# Patient Record
Sex: Female | Born: 1995 | Race: White | Hispanic: No | Marital: Single | Smoking: Never smoker
Health system: Southern US, Community
[De-identification: ages and names within clinical notes are randomized; demographics above are authoritative.]

## PROBLEM LIST (undated history)

## (undated) DIAGNOSIS — K589 Irritable bowel syndrome without diarrhea: Secondary | ICD-10-CM

## (undated) HISTORY — DX: Irritable bowel syndrome without diarrhea: K58.9

---
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
## 2016-08-27 DIAGNOSIS — J029 Acute pharyngitis, unspecified: Secondary | ICD-10-CM | POA: Diagnosis not present

## 2016-08-27 DIAGNOSIS — L700 Acne vulgaris: Secondary | ICD-10-CM | POA: Diagnosis not present

## 2016-10-05 DIAGNOSIS — J358 Other chronic diseases of tonsils and adenoids: Secondary | ICD-10-CM | POA: Diagnosis not present

## 2016-12-01 DIAGNOSIS — J019 Acute sinusitis, unspecified: Secondary | ICD-10-CM | POA: Diagnosis not present

## 2017-01-26 DIAGNOSIS — Z23 Encounter for immunization: Secondary | ICD-10-CM | POA: Diagnosis not present

## 2017-05-24 DIAGNOSIS — K219 Gastro-esophageal reflux disease without esophagitis: Secondary | ICD-10-CM | POA: Diagnosis not present

## 2017-05-25 ENCOUNTER — Other Ambulatory Visit (HOSPITAL_COMMUNITY): Payer: Self-pay | Admitting: Family Medicine

## 2017-05-25 DIAGNOSIS — R1011 Right upper quadrant pain: Secondary | ICD-10-CM

## 2017-05-25 DIAGNOSIS — R1013 Epigastric pain: Secondary | ICD-10-CM

## 2017-06-02 ENCOUNTER — Other Ambulatory Visit (HOSPITAL_COMMUNITY): Payer: Self-pay | Admitting: Family Medicine

## 2017-06-02 ENCOUNTER — Ambulatory Visit (HOSPITAL_COMMUNITY)
Admission: RE | Admit: 2017-06-02 | Discharge: 2017-06-02 | Disposition: A | Discharge status: Home / Self Care | Payer: 59 | Source: Ambulatory Visit | Attending: Family Medicine | Admitting: Family Medicine

## 2017-06-02 DIAGNOSIS — R1011 Right upper quadrant pain: Secondary | ICD-10-CM | POA: Diagnosis not present

## 2017-06-02 DIAGNOSIS — R1013 Epigastric pain: Secondary | ICD-10-CM | POA: Diagnosis not present

## 2017-07-08 DIAGNOSIS — A084 Viral intestinal infection, unspecified: Secondary | ICD-10-CM | POA: Diagnosis not present

## 2017-08-28 DIAGNOSIS — R5383 Other fatigue: Secondary | ICD-10-CM | POA: Diagnosis not present

## 2017-11-04 DIAGNOSIS — R002 Palpitations: Secondary | ICD-10-CM | POA: Diagnosis not present

## 2017-11-04 DIAGNOSIS — J039 Acute tonsillitis, unspecified: Secondary | ICD-10-CM | POA: Diagnosis not present

## 2017-11-15 ENCOUNTER — Encounter: Payer: Self-pay | Admitting: Advanced Practice Midwife

## 2017-12-07 DIAGNOSIS — Z6832 Body mass index (BMI) 32.0-32.9, adult: Secondary | ICD-10-CM | POA: Diagnosis not present

## 2017-12-07 DIAGNOSIS — R5383 Other fatigue: Secondary | ICD-10-CM | POA: Diagnosis not present

## 2017-12-29 DIAGNOSIS — Z6833 Body mass index (BMI) 33.0-33.9, adult: Secondary | ICD-10-CM | POA: Diagnosis not present

## 2017-12-29 DIAGNOSIS — R5383 Other fatigue: Secondary | ICD-10-CM | POA: Diagnosis not present

## 2017-12-29 DIAGNOSIS — Z23 Encounter for immunization: Secondary | ICD-10-CM | POA: Diagnosis not present

## 2018-01-23 DIAGNOSIS — R300 Dysuria: Secondary | ICD-10-CM | POA: Diagnosis not present

## 2018-01-23 DIAGNOSIS — K5900 Constipation, unspecified: Secondary | ICD-10-CM | POA: Diagnosis not present

## 2018-01-23 DIAGNOSIS — Z8349 Family history of other endocrine, nutritional and metabolic diseases: Secondary | ICD-10-CM | POA: Diagnosis not present

## 2018-04-17 DIAGNOSIS — Z6834 Body mass index (BMI) 34.0-34.9, adult: Secondary | ICD-10-CM | POA: Diagnosis not present

## 2018-05-08 DIAGNOSIS — Z23 Encounter for immunization: Secondary | ICD-10-CM | POA: Diagnosis not present

## 2018-06-29 DIAGNOSIS — Z6832 Body mass index (BMI) 32.0-32.9, adult: Secondary | ICD-10-CM | POA: Diagnosis not present

## 2018-08-27 DIAGNOSIS — J358 Other chronic diseases of tonsils and adenoids: Secondary | ICD-10-CM | POA: Diagnosis not present

## 2018-08-27 DIAGNOSIS — Z6833 Body mass index (BMI) 33.0-33.9, adult: Secondary | ICD-10-CM | POA: Diagnosis not present

## 2019-05-31 IMAGING — US US ABDOMEN COMPLETE
1 series · 14 of 25 positions shown · non-contrast
Comparison: None.

CLINICAL DATA: Right upper quadrant abdominal pain and epigastric
pain.

EXAM:
ABDOMEN ULTRASOUND COMPLETE

[Series 1: us abdomen complete · 0.18mm/px · 14 of 102 slices shown]
[im 1/102]
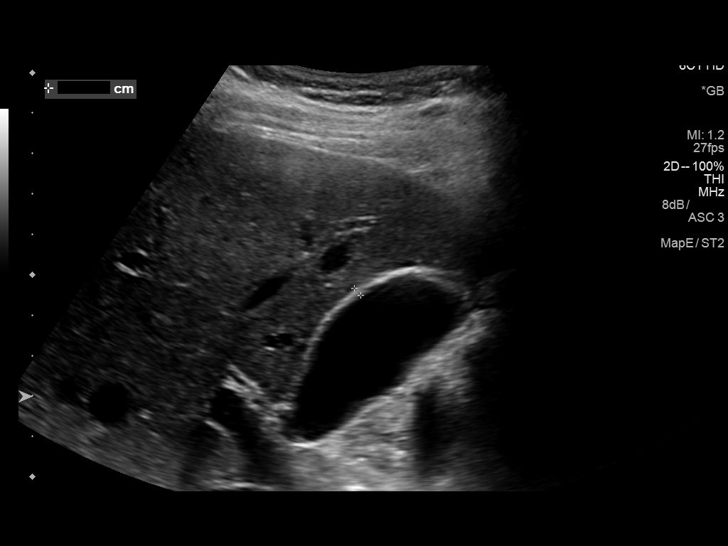
[im 9/102]
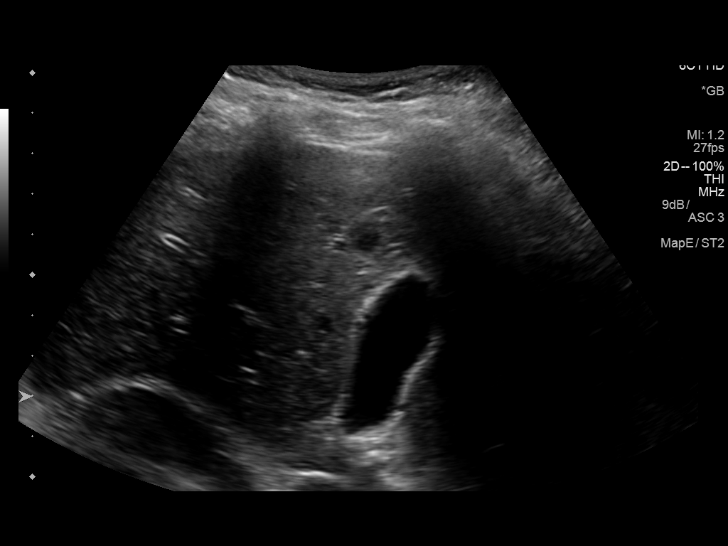
[im 17/102]
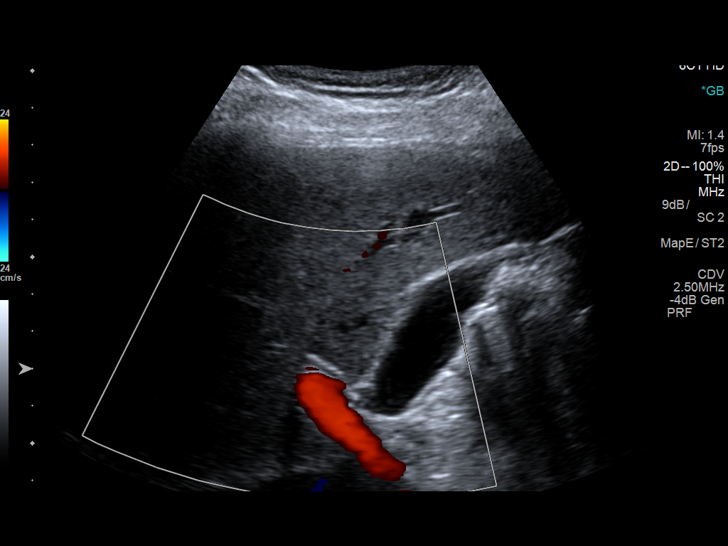
[im 26/102]
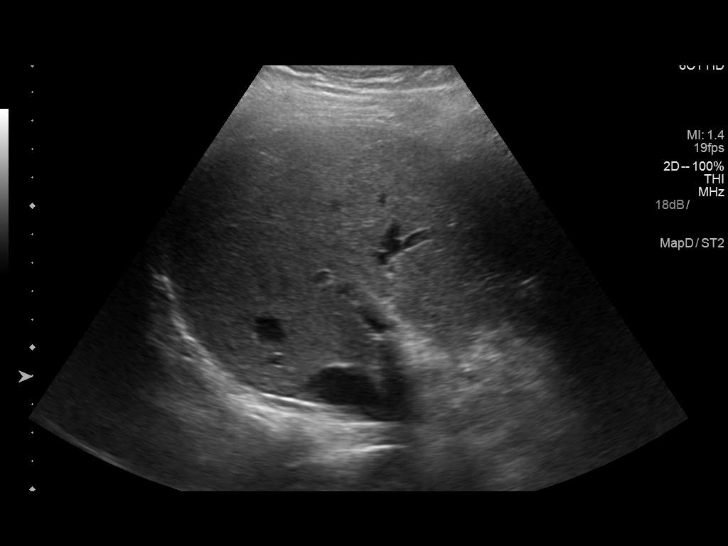
[im 34/102]
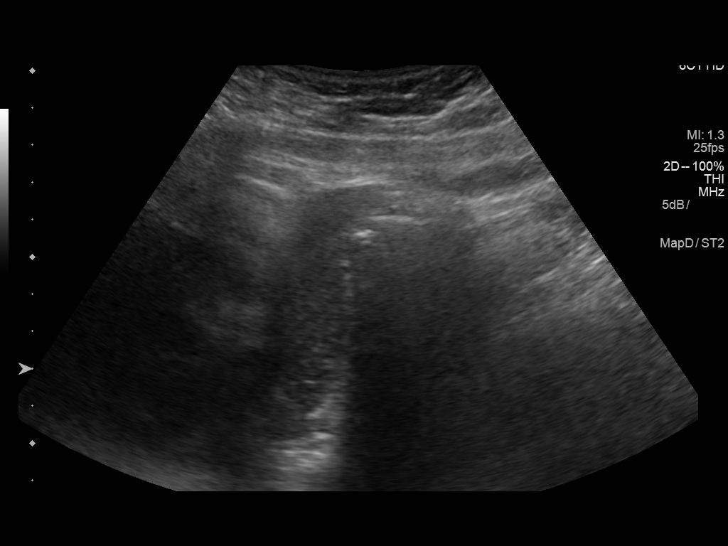
[im 38/102]
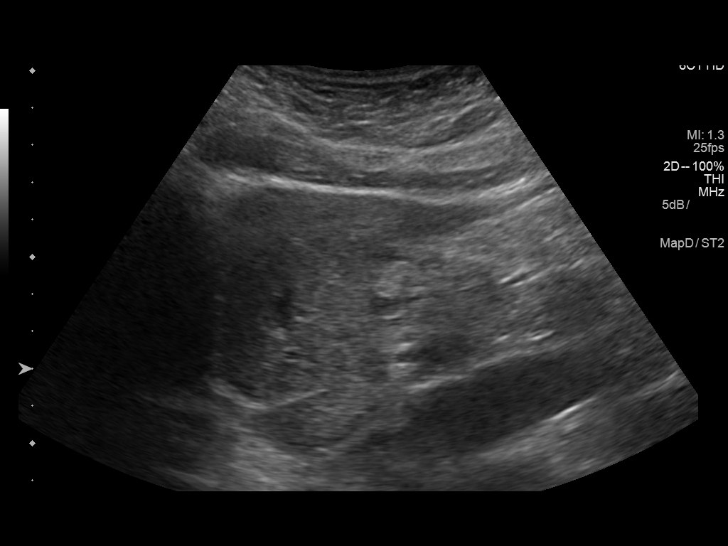
[im 47/102]
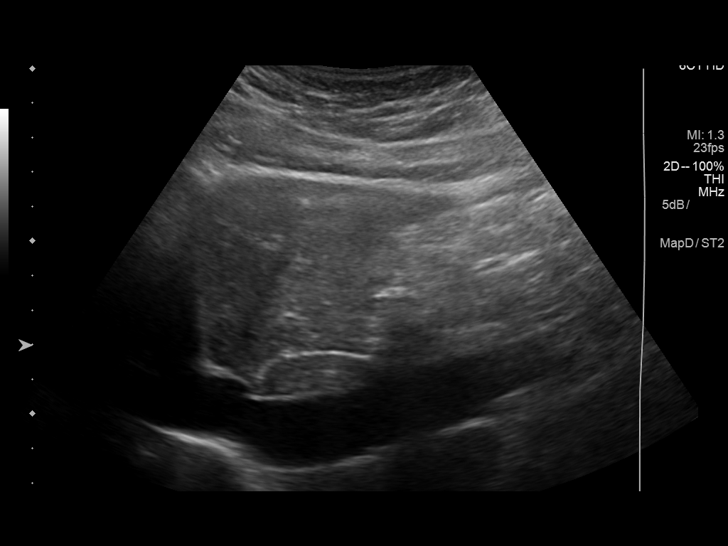
[im 55/102]
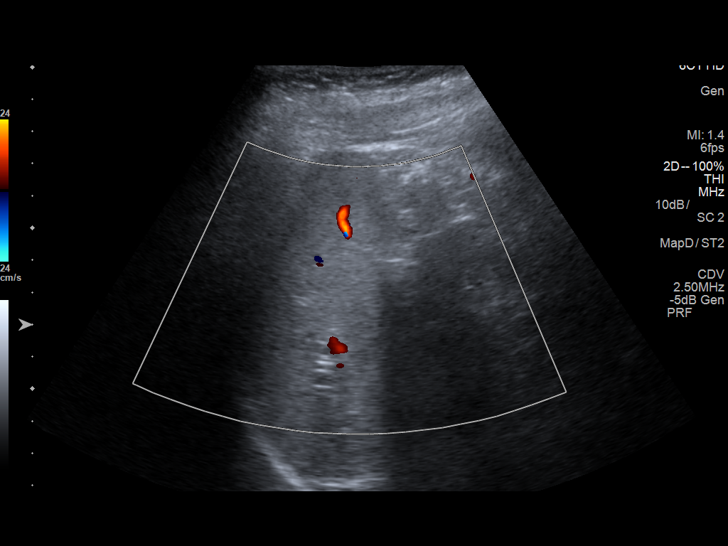
[im 64/102]
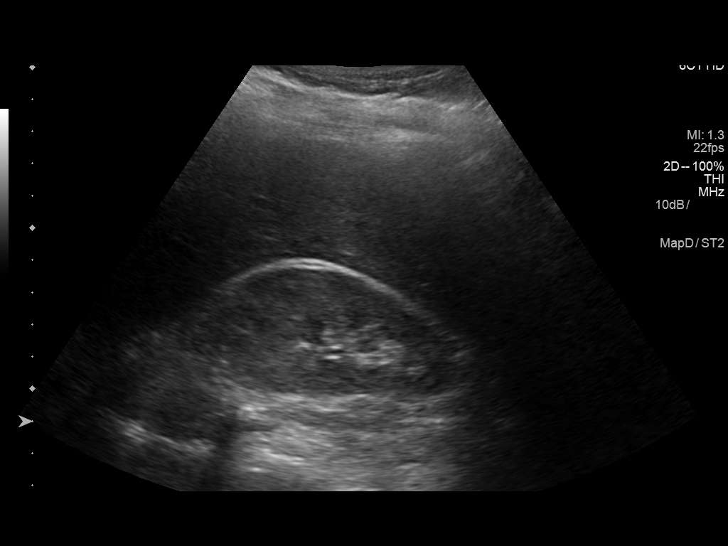
[im 68/102]
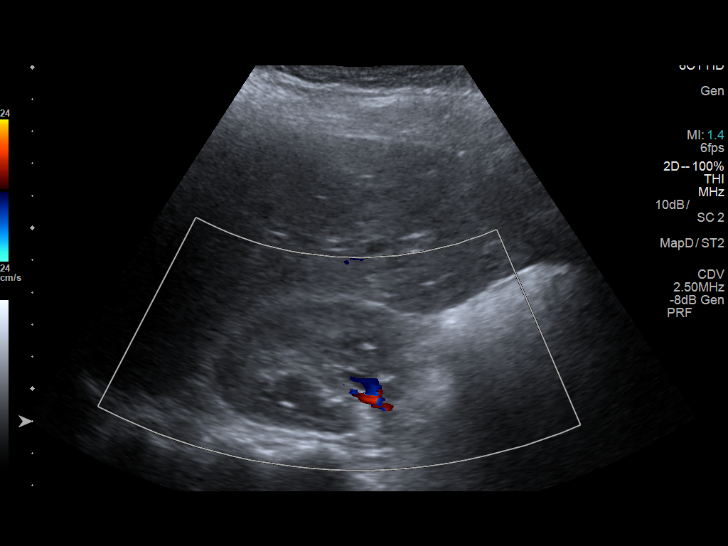
[im 76/102]
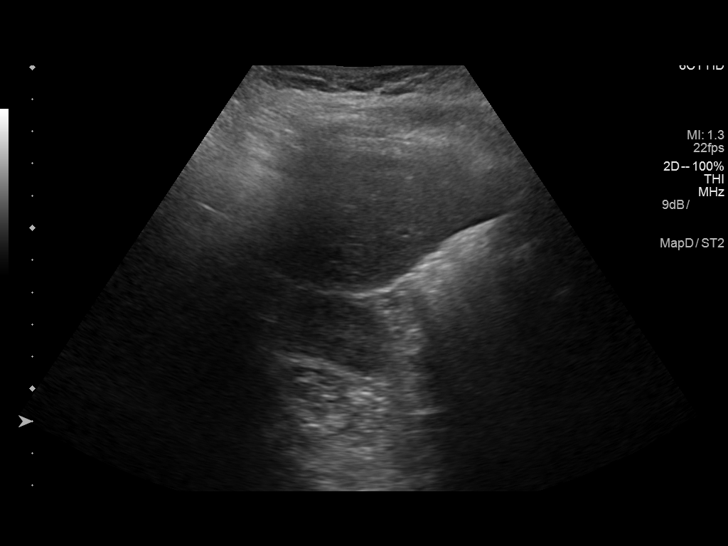
[im 85/102]
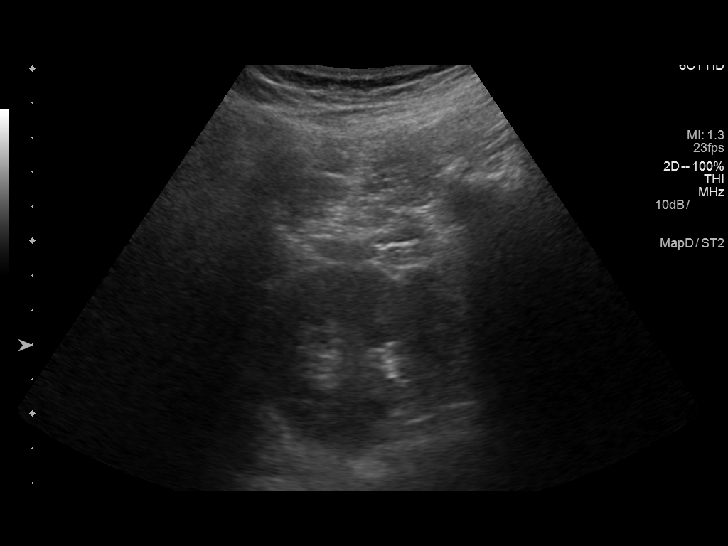
[im 93/102]
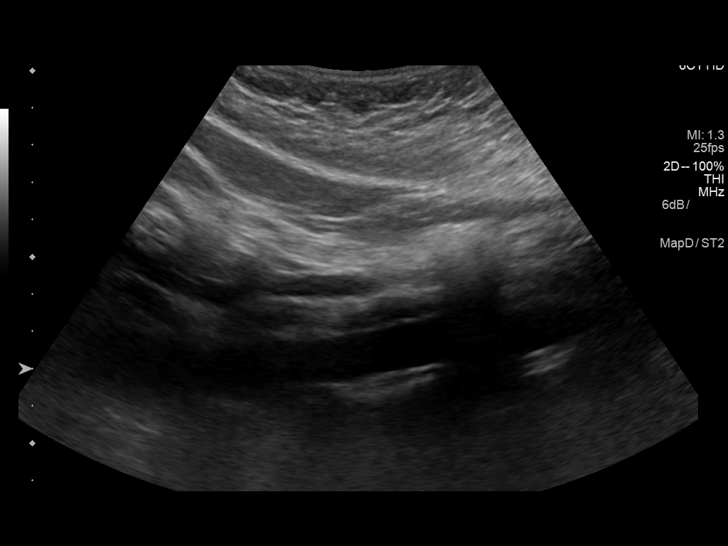
[im 102/102]
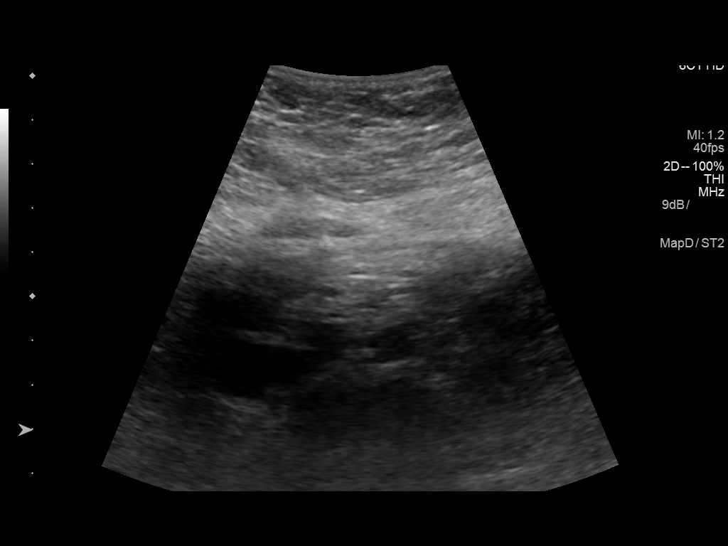

[14 of 25 positions shown; findings below may reference images not displayed]

FINDINGS: Gallbladder: No gallstones or wall thickening visualized. No
sonographic Murphy sign noted by sonographer.

Common bile duct: Diameter: 3 mm

Liver: No focal lesion identified. Within normal limits in
parenchymal echogenicity. Portal vein is patent on color Doppler
imaging with normal direction of blood flow towards the liver.

IVC: No abnormality visualized.

Pancreas: Visualized portion unremarkable. The pancreatic head and
tail were not well seen due to overlying bowel gas.

Spleen: Size and appearance within normal limits.

Right Kidney: Length: 9.4 cm. Echogenicity within normal limits. No
mass or hydronephrosis visualized.

Left Kidney: Length: 9.2 cm. Echogenicity within normal limits. No
mass or hydronephrosis visualized.

Abdominal aorta: No aneurysm visualized.

Other findings: None.
IMPRESSION: Normal sonographic appearance of the abdomen. Please note that
portions of the pancreatic head and tail were not well seen due to
overlying bowel gas.

## 2020-06-24 ENCOUNTER — Other Ambulatory Visit: Payer: Self-pay | Admitting: Physician Assistant

## 2020-06-24 ENCOUNTER — Other Ambulatory Visit (HOSPITAL_COMMUNITY): Payer: Self-pay | Admitting: Physician Assistant

## 2020-06-24 DIAGNOSIS — R1084 Generalized abdominal pain: Secondary | ICD-10-CM

## 2020-06-26 ENCOUNTER — Emergency Department (HOSPITAL_COMMUNITY)
Admission: EM | Admit: 2020-06-26 | Discharge: 2020-06-26 | Disposition: A | Discharge status: Home / Self Care | Payer: 59 | Attending: Emergency Medicine | Admitting: Emergency Medicine

## 2020-06-26 ENCOUNTER — Encounter (HOSPITAL_COMMUNITY): Payer: Self-pay | Admitting: *Deleted

## 2020-06-26 ENCOUNTER — Other Ambulatory Visit: Payer: Self-pay

## 2020-06-26 ENCOUNTER — Emergency Department (HOSPITAL_COMMUNITY): Payer: 59

## 2020-06-26 DIAGNOSIS — R1010 Upper abdominal pain, unspecified: Secondary | ICD-10-CM | POA: Diagnosis not present

## 2020-06-26 DIAGNOSIS — R112 Nausea with vomiting, unspecified: Secondary | ICD-10-CM | POA: Insufficient documentation

## 2020-06-26 DIAGNOSIS — R3589 Other polyuria: Secondary | ICD-10-CM | POA: Diagnosis not present

## 2020-06-26 DIAGNOSIS — R1084 Generalized abdominal pain: Secondary | ICD-10-CM | POA: Diagnosis not present

## 2020-06-26 DIAGNOSIS — R110 Nausea: Secondary | ICD-10-CM

## 2020-06-26 LAB — CBC
HCT: 43.3 % (ref 36.0–46.0)
Hemoglobin: 14.9 g/dL (ref 12.0–15.0)
MCH: 31.2 pg (ref 26.0–34.0)
MCHC: 34.4 g/dL (ref 30.0–36.0)
MCV: 90.6 fL (ref 80.0–100.0)
Platelets: 316 10*3/uL (ref 150–400)
RBC: 4.78 MIL/uL (ref 3.87–5.11)
RDW: 12.1 % (ref 11.5–15.5)
WBC: 6 10*3/uL (ref 4.0–10.5)
nRBC: 0 % (ref 0.0–0.2)

## 2020-06-26 LAB — COMPREHENSIVE METABOLIC PANEL
ALT: 17 U/L (ref 0–44)
AST: 19 U/L (ref 15–41)
Albumin: 3.9 g/dL (ref 3.5–5.0)
Alkaline Phosphatase: 45 U/L (ref 38–126)
Anion gap: 8 (ref 5–15)
BUN: 5 mg/dL — ABNORMAL LOW (ref 6–20)
CO2: 25 mmol/L (ref 22–32)
Calcium: 9.2 mg/dL (ref 8.9–10.3)
Chloride: 105 mmol/L (ref 98–111)
Creatinine, Ser: 1.13 mg/dL — ABNORMAL HIGH (ref 0.44–1.00)
GFR, Estimated: >60 mL/min (ref >60)
Glucose, Bld: 94 mg/dL (ref 70–99)
Potassium: 3.9 mmol/L (ref 3.5–5.1)
Sodium: 138 mmol/L (ref 135–145)
Total Bilirubin: 1.1 mg/dL (ref 0.3–1.2)
Total Protein: 7.1 g/dL (ref 6.5–8.1)

## 2020-06-26 LAB — URINALYSIS, ROUTINE W REFLEX MICROSCOPIC
Bilirubin Urine: NEGATIVE
Glucose, UA: NEGATIVE mg/dL
Ketones, ur: NEGATIVE mg/dL
Leukocytes,Ua: NEGATIVE
Nitrite: NEGATIVE
Protein, ur: NEGATIVE mg/dL
Specific Gravity, Urine: <1.005 — ABNORMAL LOW (ref 1.005–1.030)
pH: 5 (ref 5.0–8.0)

## 2020-06-26 LAB — I-STAT BETA HCG BLOOD, ED (MC, WL, AP ONLY): I-stat hCG, quantitative: <5 m[IU]/mL (ref <5)

## 2020-06-26 LAB — URINALYSIS, MICROSCOPIC (REFLEX)
Bacteria, UA: NONE SEEN
RBC / HPF: NONE SEEN RBC/hpf (ref 0–5)

## 2020-06-26 LAB — LIPASE, BLOOD: Lipase: 35 U/L (ref 11–51)

## 2020-06-26 MED ORDER — FENTANYL CITRATE (PF) 100 MCG/2ML IJ SOLN
50.0000 ug | Freq: Once | INTRAMUSCULAR | Status: DC
Start: 2020-06-26 — End: 2020-06-26

## 2020-06-26 MED ORDER — SODIUM CHLORIDE 0.9 % IV BOLUS
1000.0000 mL | Freq: Once | INTRAVENOUS | Status: AC
  Administered 2020-06-26: 1000 mL via INTRAVENOUS

## 2020-06-26 NOTE — Discharge Instructions (Addendum)
As discussed, your evaluation today has been largely reassuring.  But, it is important that you monitor your condition carefully, and do not hesitate to return to the ED if you develop new, or concerning changes in your condition. ? ?Otherwise, please follow-up with your physician for appropriate ongoing care. ? ?

## 2020-06-26 NOTE — ED Notes (Signed)
Reviewed discharge instructions with patient and mother. Follow-up care reviewed. Patient and mother verbalized understanding. Patient A&Ox4, VSS, and ambulatory with steady gait upon discharge. 

## 2020-06-26 NOTE — ED Notes (Signed)
Went to hook pt up to monitoring equipment and found that there are not cords for the monitor in the room. ED tech in search of cords.

## 2020-06-26 NOTE — ED Notes (Signed)
Pt transported to US

## 2020-06-26 NOTE — ED Triage Notes (Signed)
Increasing nausea for over a month ago, she has been seen for this and placed on zofran and omeprazole without relief.  Pt has been having night sweats also, she has had a lack of appetitie.  Pt feels dehydrated.  Pt denies any pregnancy and is on her period at this time.  Abdominal US scheduled at her MD for 3/24.

## 2020-06-26 NOTE — ED Provider Notes (Signed)
[facility] EMERGENCY DEPARTMENT Provider Note   CSN: [id number] Arrival date & time: 06/26/20  1055     History Chief Complaint  Patient presents with  . Nausea    Valerie Peterson is a 25 y.o. female.  HPI Patient presents with her mother who assists with the history. She is here due to abdominal pain, nausea, vomiting, and change in frequency of bowel movements. She states that she is generally well, was so until symptoms began perhaps a few weeks ago.  She has been seen and evaluated by a physician assistant at another [facility].  She notes that she has recently developed upper abdominal pain, bilateral flank pain.  There is new/worsening p.o. intolerance, with pain with swallowing. Where she was previously having bowel movements infrequently, she notes that she is currently having them daily, with associated diffuse abdominal pain.  No dysuria, though there is polyuria. No relief in spite of taking omeprazole.     History reviewed. No pertinent past medical history.  There are no problems to display for this patient.   History reviewed. No pertinent surgical history.   OB History   No obstetric history on file.     No family history on file.  Social History   Tobacco Use  . Smoking status: Never Smoker  . Smokeless tobacco: Never Used  Substance Use Topics  . Alcohol use: Never  . Drug use: Never    Home Medications Prior to Admission medications   Medication Sig Start Date End Date Taking? Authorizing Provider  AUROVELA FE 1/20 1-20 MG-MCG tablet Take 1 tablet by mouth every evening. 06/23/20  Yes [provider]  omeprazole (PRILOSEC) 20 MG capsule Take 20 mg by mouth daily. 06/23/20  Yes [provider]  ondansetron (ZOFRAN-ODT) 4 MG disintegrating tablet Take 4 mg by mouth every morning. 06/26/20  Yes [provider]  OVER THE COUNTER MEDICATION Take 1 capsule by mouth at bedtime. Probiotic with Melatonin    Yes [provider]    Allergies    Amoxicillin and Penicillins  Review of Systems   Review of Systems  Constitutional:       Per HPI, otherwise negative  HENT:       Per HPI, otherwise negative  Respiratory:       Per HPI, otherwise negative  Cardiovascular:       Per HPI, otherwise negative  Gastrointestinal: Positive for abdominal pain, nausea and vomiting.  Endocrine:       Negative aside from HPI  Genitourinary:       Neg aside from HPI   Musculoskeletal:       Per HPI, otherwise negative  Skin: Negative.   Neurological: Negative for syncope.    Physical Exam Updated Vital Signs BP (!) 110/56 (BP Location: Left Arm)   Pulse 73   Temp 98.5 F (36.9 C)   Resp 16   Ht 5\' 1"  (1.549 m)   Wt 80.3 kg   SpO2 100%   BMI 33.44 kg/m   Physical Exam Vitals and nursing note reviewed.  Constitutional:      General: She is not in acute distress.    Appearance: She is well-developed.  HENT:     Head: Normocephalic and atraumatic.  Eyes:     Conjunctiva/sclera: Conjunctivae normal.  Cardiovascular:     Rate and Rhythm: Normal rate and regular rhythm.  Pulmonary:     Effort: Pulmonary effort is normal. No respiratory distress.     Breath  sounds: Normal breath sounds. No stridor.  Abdominal:     General: There is no distension.     Tenderness: There is no guarding.  Skin:    General: Skin is warm and dry.  Neurological:     Mental Status: She is alert and oriented to person, place, and time.     Cranial Nerves: No cranial nerve deficit.     ED Results / Procedures / Treatments   Labs (all labs ordered are listed, but only abnormal results are displayed) Labs Reviewed  COMPREHENSIVE METABOLIC PANEL - Abnormal; Notable for the following components:      Result Value   BUN 5 (*)    Creatinine, Ser 1.13 (*)    All other components within normal limits  URINALYSIS, ROUTINE W REFLEX MICROSCOPIC - Abnormal; Notable for the following components:    Specific Gravity, Urine <1.005 (*)    Hgb urine dipstick MODERATE (*)    All other components within normal limits  LIPASE, BLOOD  CBC  URINALYSIS, MICROSCOPIC (REFLEX)  I-STAT BETA HCG BLOOD, ED ([facility], [facility], [facility] ONLY)    EKG None  Radiology US Abdomen Complete  Result Date: 06/26/2020 CLINICAL DATA:  Upper abdominal pain with nausea and vomiting EXAM: ABDOMEN ULTRASOUND COMPLETE COMPARISON:  06/02/2017 FINDINGS: Gallbladder: No gallstones or wall thickening visualized. No sonographic Murphy sign noted by sonographer. Common bile duct: Diameter: Normal, 2 mm Liver: No focal lesion identified. Within normal limits in parenchymal echogenicity. Portal vein is patent on color Doppler imaging with normal direction of blood flow towards the liver. IVC: No abnormality visualized. Pancreas: Visualized portion unremarkable. Spleen: Size and appearance within normal limits. Right Kidney: Length: 9.6 cm. Echogenicity within normal limits. No mass or hydronephrosis visualized. Left Kidney: Length: 10.0 cm. Echogenicity within normal limits. No mass or hydronephrosis visualized. Abdominal aorta: No aneurysm visualized. Other findings: No ascites. IMPRESSION: No acute process or explanation for abdominal pain/nausea/vomiting. Electronically Signed   By: Jeronimo Greaves M.D.   On: 06/26/2020 16:02    Procedures Procedures   Medications Ordered in ED Medications  sodium chloride 0.9 % bolus 1,000 mL (1,000 mLs Intravenous New Bag/Given 06/26/20 1228)    ED Course  I have reviewed the triage vital signs and the nursing notes.  Pertinent labs & imaging results that were available during my care of the patient were reviewed by me and considered in my medical decision making (see chart for details).  4:22 PM On repeat exam patient is awake, alert, in no distress.  Vital signs remain unremarkable. I discussed the findings, including ultrasound results which I reviewed with both of them. No evidence for acute new  pathology, reassuring labs, ultrasound, no hepatobiliary dysfunction. Given the absence of ongoing complaints, patient appropriate for further evaluation, monitoring as an outpatient, to which she is amenable. Final Clinical Impression(s) / ED Diagnoses Final diagnoses:  Nausea     Gerhard Munch, MD 06/26/20 1623

## 2020-07-02 ENCOUNTER — Ambulatory Visit (HOSPITAL_COMMUNITY): Payer: 59

## 2020-07-02 ENCOUNTER — Encounter (HOSPITAL_COMMUNITY): Payer: Self-pay

## 2020-07-14 ENCOUNTER — Encounter: Payer: Self-pay | Admitting: Internal Medicine

## 2020-09-02 ENCOUNTER — Ambulatory Visit (INDEPENDENT_AMBULATORY_CARE_PROVIDER_SITE_OTHER): Payer: 59 | Admitting: Internal Medicine

## 2020-09-02 ENCOUNTER — Other Ambulatory Visit: Payer: Self-pay

## 2020-09-02 ENCOUNTER — Encounter: Payer: Self-pay | Admitting: Internal Medicine

## 2020-09-02 VITALS — BP 107/60 | HR 80 | Temp 97.7°F | Ht 61.0 in | Wt 187.0 lb

## 2020-09-02 DIAGNOSIS — R1030 Lower abdominal pain, unspecified: Secondary | ICD-10-CM | POA: Diagnosis not present

## 2020-09-02 DIAGNOSIS — K625 Hemorrhage of anus and rectum: Secondary | ICD-10-CM | POA: Diagnosis not present

## 2020-09-02 DIAGNOSIS — K581 Irritable bowel syndrome with constipation: Secondary | ICD-10-CM

## 2020-09-02 NOTE — Patient Instructions (Signed)
For your chronic constipation and irritable bowel syndrome, I am going to give you samples of Linzess 290 mcg daily.  Take this once in the a.m. every day.  You may have an initial washout period Over the first couple days and this should improve as your bowels become more regular.  If your symptoms are improved, please call our office and I will send in a formal prescription.  Follow-up in 4 months.  If your symptoms are not improved or worsen, we can consider further evaluation with colonoscopy at that time.  At [facility] we value your feedback. You may receive a survey about your visit today. Please share your experience as we strive to create trusting relationships with our patients to provide genuine, compassionate, quality care.  We appreciate your understanding and patience as we review any laboratory studies, imaging, and other diagnostic tests that are ordered as we care for you. Our office policy is 5 business days for review of these results, and any emergent or urgent results are addressed in a timely manner for your best interest. If you do not hear from our office in 1 week, please contact us.   We also encourage the use of MyChart, which contains your medical information for your review as well. If you are not enrolled in this feature, an access code is on this after visit summary for your convenience. Thank you for allowing [place] to be involved in your care.  It was great to see you today!  I hope you have a great rest of your spring!!    Hennie Duos. Marletta Lor, D.O. Gastroenterology and Hepatology [facility]

## 2020-09-02 NOTE — Progress Notes (Signed)
Primary Care Physician:  [facility] Primary Gastroenterologist:  Dr. Marletta Lor  Chief Complaint  Patient presents with  . Abdominal Pain  . Nausea  . Emesis  . Diarrhea  . Constipation    HPI:   Valerie Peterson is a 25 y.o. female who presents to the clinic today by referral from her PCP Lorie Phenix for evaluation.  Patient states she has had GI issues her entire life.  Primarily she has chronic constipation at baseline.  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  During that time her GI symptoms changed to nausea, vomiting, diarrhea as well as abdominal pain.  She was started on omeprazole and given dicyclomine.  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  She does note the dicyclomine helped a lot as well.  She states she was quite stressed during this time and is now less anxious about things.  Main complaint for me today is chronic constipation.  She will have a bowel movement on average once every week.  States her stools are hard.  She also has rectal bleeding at times after she strains.   No family history of colon cancer or inflammatory bowel disease.   Past Medical History:  Diagnosis Date  . Irritable bowel syndrome     Past Surgical History:  Procedure Laterality Date  . WISDOM TOOTH EXTRACTION      Current Outpatient Medications  Medication Sig Dispense Refill  . AUROVELA FE 1/20 1-20 MG-MCG tablet Take 1 tablet by mouth every evening.    . dicyclomine (BENTYL) 10 MG capsule Take 10 mg by mouth 3 (three) times daily.    . ondansetron (ZOFRAN-ODT) 4 MG disintegrating tablet Take 4 mg by mouth every morning.    Marland Kitchen omeprazole (PRILOSEC) 20 MG capsule Take 20 mg by mouth daily. (Patient not taking: Reported on 09/02/2020)    . OVER THE COUNTER MEDICATION Take 1 capsule by mouth at bedtime. Probiotic with Melatonin (Patient not taking: Reported on 09/02/2020)     No current facility-administered  medications for this visit.    Allergies as of 09/02/2020 - Review Complete 09/02/2020  Allergen Reaction Noted  . Amoxicillin Nausea Only and Other (See Comments) 06/26/2020  . Penicillins Nausea Only and Other (See Comments) 06/26/2020    Family History  Problem Relation Age of Onset  . Irritable bowel syndrome Mother   . Breast cancer Maternal Aunt     Social History   Socioeconomic History  . Marital status: Single    Spouse name: Not on file  . Number of children: Not on file  . Years of education: Not on file  . Highest education level: Not on file  Occupational History  . Not on file  Tobacco Use  . Smoking status: Never Smoker  . Smokeless tobacco: Never Used  Substance and Sexual Activity  . Alcohol use: Never  . Drug use: Never  . Sexual activity: Not on file  Other Topics Concern  . Not on file  Social History Narrative  . Not on file   Social Determinants of Health   Financial Resource Strain: Not on file  Food Insecurity: Not on file  Transportation Needs: Not on file  Physical Activity: Not on file  Stress: Not on file  Social Connections: Not on file  Intimate Partner Violence: Not on file    Subjective: Review of Systems  Constitutional: Negative for chills and fever.  HENT: Negative for congestion and  hearing loss.   Eyes: Negative for blurred vision and double vision.  Respiratory: Negative for cough and shortness of breath.   Cardiovascular: Negative for chest pain and palpitations.  Gastrointestinal: Positive for blood in stool, constipation and nausea. Negative for abdominal pain, diarrhea, heartburn, melena and vomiting.  Genitourinary: Negative for dysuria and urgency.  Musculoskeletal: Negative for joint pain and myalgias.  Skin: Negative for itching and rash.  Neurological: Negative for dizziness and headaches.  Psychiatric/Behavioral: Negative for depression. The patient is not nervous/anxious.        Objective: BP 107/60    Pulse 80   Temp 97.7 F (36.5 C) (Temporal)   Ht 5\' 1"  (1.549 m)   Wt 187 lb (84.8 kg)   LMP 08/12/2020 (Exact Date)   BMI 35.33 kg/m  Physical Exam Constitutional:      Appearance: Normal appearance.  HENT:     Head: Normocephalic and atraumatic.  Eyes:     Extraocular Movements: Extraocular movements intact.     Conjunctiva/sclera: Conjunctivae normal.  Cardiovascular:     Rate and Rhythm: Normal rate and regular rhythm.  Pulmonary:     Effort: Pulmonary effort is normal.     Breath sounds: Normal breath sounds.  Abdominal:     General: Bowel sounds are normal.     Palpations: Abdomen is soft.  Musculoskeletal:        General: No swelling. Normal range of motion.     Cervical back: Normal range of motion and neck supple.  Skin:    General: Skin is warm and dry.     Coloration: Skin is not jaundiced.  Neurological:     General: No focal deficit present.     Mental Status: She is alert and oriented to person, place, and time.  Psychiatric:        Mood and Affect: Mood normal.        Behavior: Behavior normal.      Assessment: *Irritable bowel syndrome-constipation predominant *Rectal bleeding *Abdominal pain  Plan: Etiology of patient's symptoms likely irritable bowel syndrome constipation predominant.  I will give her samples of Linzess 290 mcg daily and see how she does.  Discussed that there could be an initial washout period and she understands.  Patient to call office if symptoms are improved and I will send in a formal prescription.  Rectal bleeding likely due to hemorrhoids.  Discussed potential colonoscopy to further evaluate to rule out other causes including inflammatory bowel disease such as Crohn's disease or ulcerative colitis, polyps, malignancy, AVMs, or other and she would like to hold off for now.  Patient to continue taking dicyclomine on an as-needed basis for breakthrough abdominal pain.  Follow-up in 4 months or sooner if needed  Thank you  10/12/2020 for the kind referral.   09/02/2020 9:05 AM   Disclaimer: This note was dictated with voice recognition software. Similar sounding words can inadvertently be transcribed and may not be corrected upon review.

## 2020-09-10 ENCOUNTER — Telehealth: Payer: Self-pay | Admitting: Internal Medicine

## 2020-09-10 NOTE — Telephone Encounter (Signed)
Pt called to give an update on her Linzess samples. She said they are working fine, but rarely has a formed stool. She didn't know if we needed to lower the strength or not. Please advise. [phone]

## 2020-09-11 NOTE — Telephone Encounter (Signed)
Phoned and spoke with the pt and was advised that she has too much diarrhea with the Linzess 290 mcg. She would like for you to send in the 145 mcg because she enjoys being able to go to the bathroom but states its explosive. So when you send in new Rx you may have to D/C the Linzess 290 mcg for insurance purposes if I have to do a PA on it

## 2020-09-15 ENCOUNTER — Telehealth: Payer: Self-pay | Admitting: *Deleted

## 2020-09-15 NOTE — Telephone Encounter (Signed)
She believes Dr. Marletta Lor was going to send in a RX for Linzess lower dose to [place] [address] here in [place].  They currently do not have it.  Can someone please check with Dr. Marletta Lor and make sure that is what he was going to do.   Pt. #  [phone]  Last Office visit 09/02/20 and last phone message 09/11/2020.

## 2020-09-15 NOTE — Telephone Encounter (Signed)
This has been sent to Dr. Marletta Lor to address for new Rx.

## 2020-09-16 ENCOUNTER — Telehealth: Payer: Self-pay | Admitting: Internal Medicine

## 2020-09-16 MED ORDER — LINACLOTIDE 145 MCG PO CAPS: 145.0000 ug | ORAL_CAPSULE | Freq: Every day | ORAL | 1 refills | Status: AC

## 2020-09-16 NOTE — Telephone Encounter (Signed)
Phoned and advised the pt that her Rx for Linzess 145 mcg had been sent in

## 2020-09-16 NOTE — Telephone Encounter (Signed)
Prescription for Linzess 145 mcg daily sent to her pharmacy.  Thank

## 2020-09-16 NOTE — Telephone Encounter (Signed)
This pt walked in the office today wanting to know had you filled her Rx yet. She was advised that you were with patients and once the Rx is sent she would be contacted. States she has not had her meds in a week

## 2020-11-19 ENCOUNTER — Encounter: Payer: Self-pay | Admitting: Internal Medicine

## 2022-01-23 IMAGING — US US ABDOMEN COMPLETE
1 series · 14 of 25 positions shown · non-contrast
Comparison: 06/02/2017

CLINICAL DATA: Upper abdominal pain with nausea and vomiting

EXAM:
ABDOMEN ULTRASOUND COMPLETE

[Series 1: us abdomen complete · 14 of 67 slices shown]
[im 1/67]
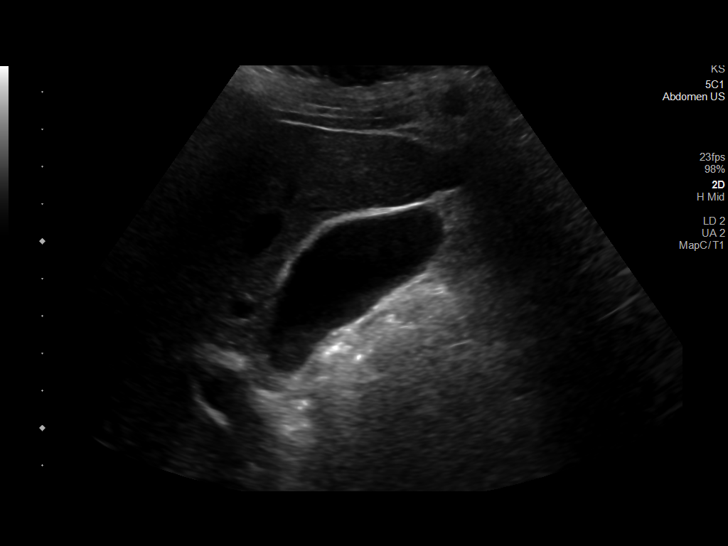
[im 6/67]
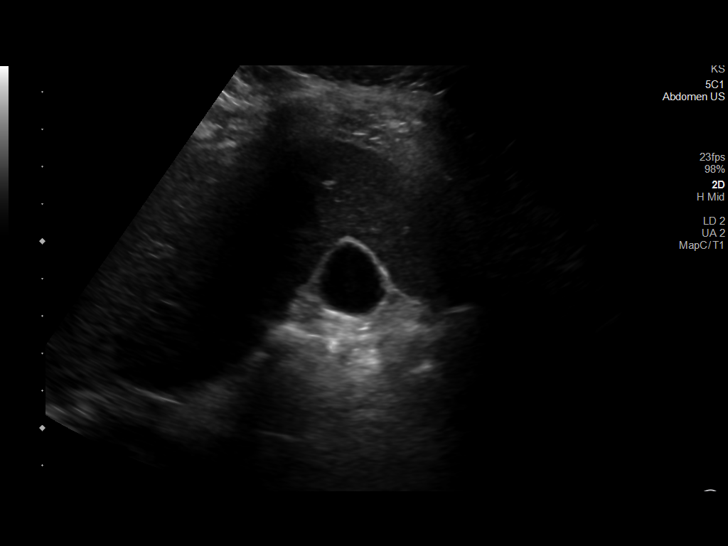
[im 12/67]
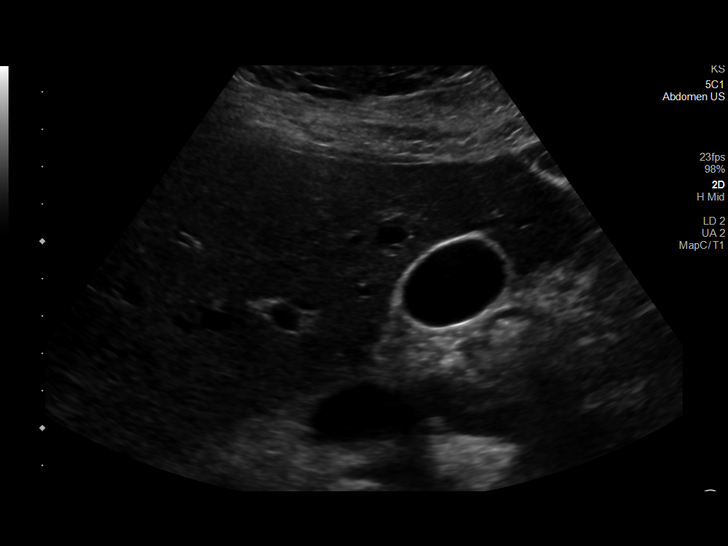
[im 17/67]
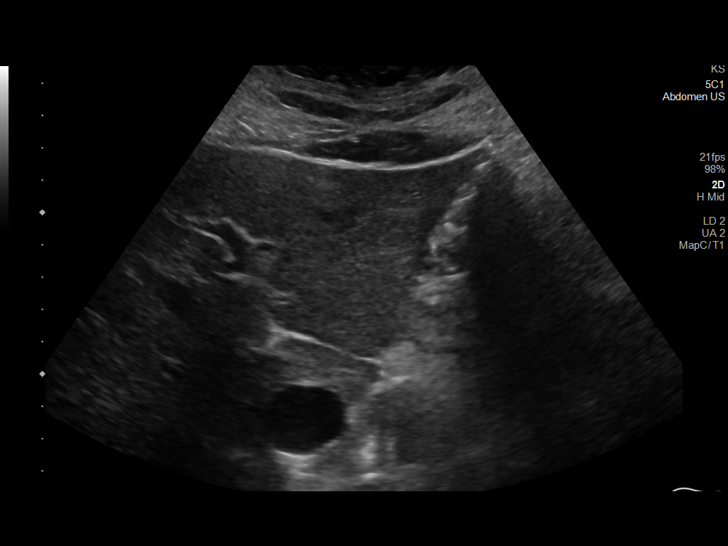
[im 23/67]
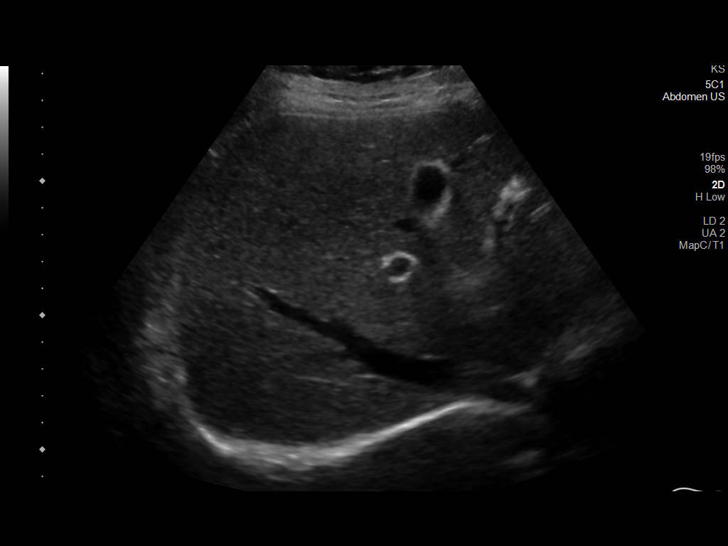
[im 25/67]
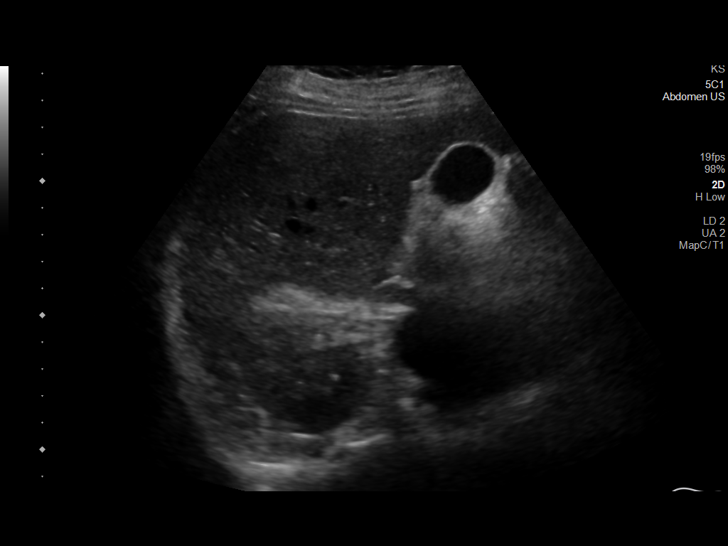
[im 31/67]
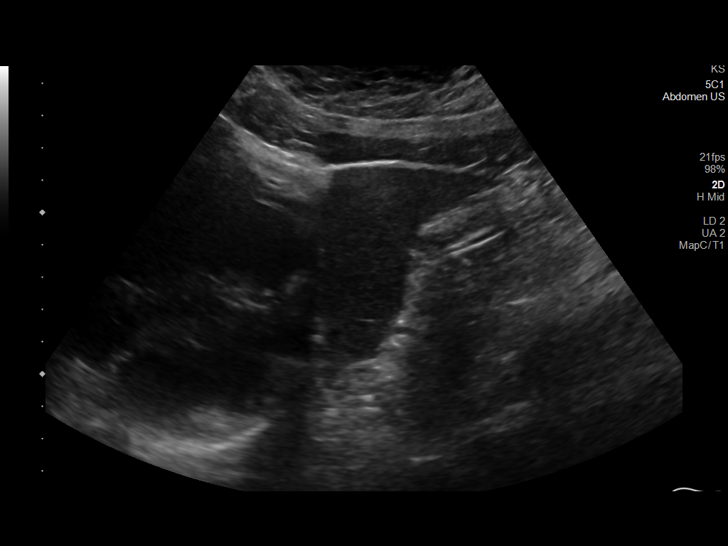
[im 36/67]
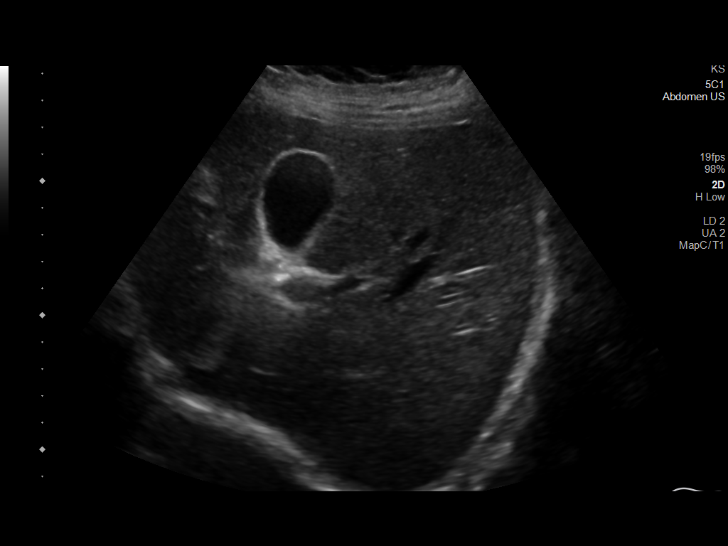
[im 42/67]
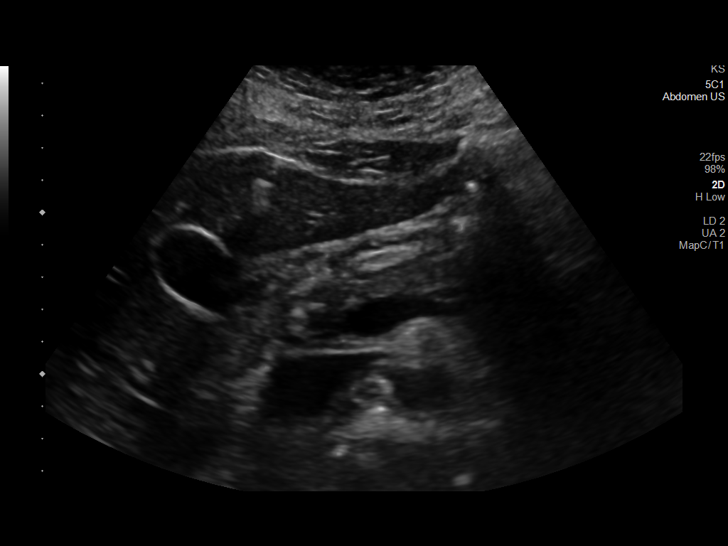
[im 45/67]
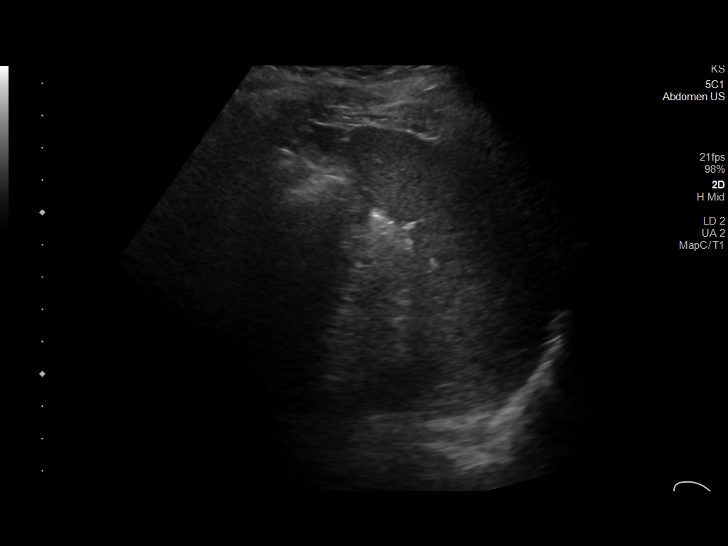
[im 50/67]
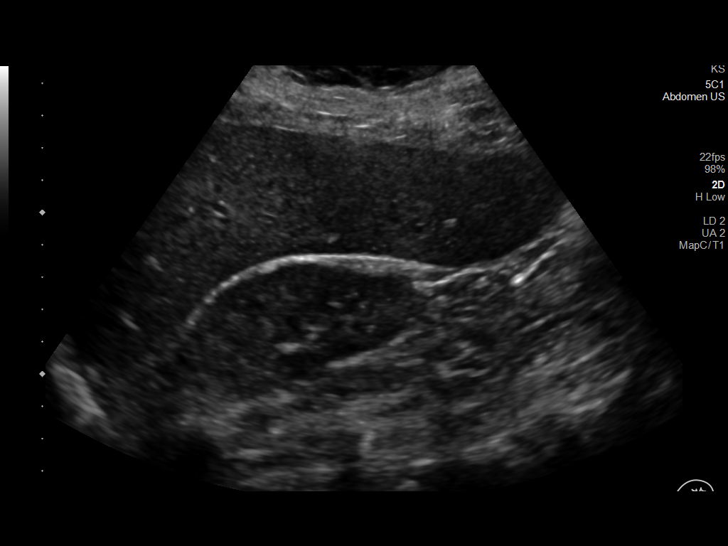
[im 56/67]
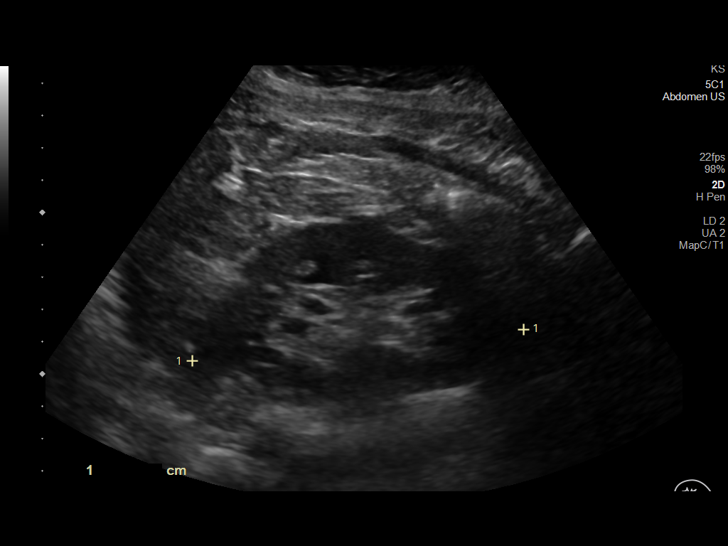
[im 61/67]
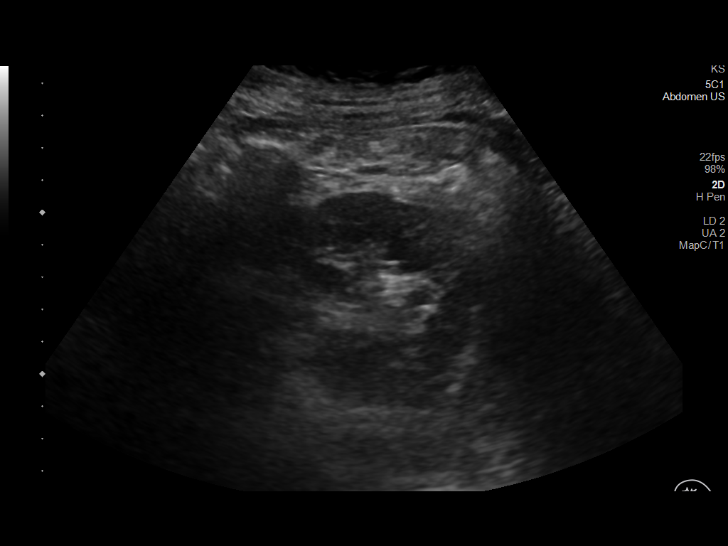
[im 67/67]
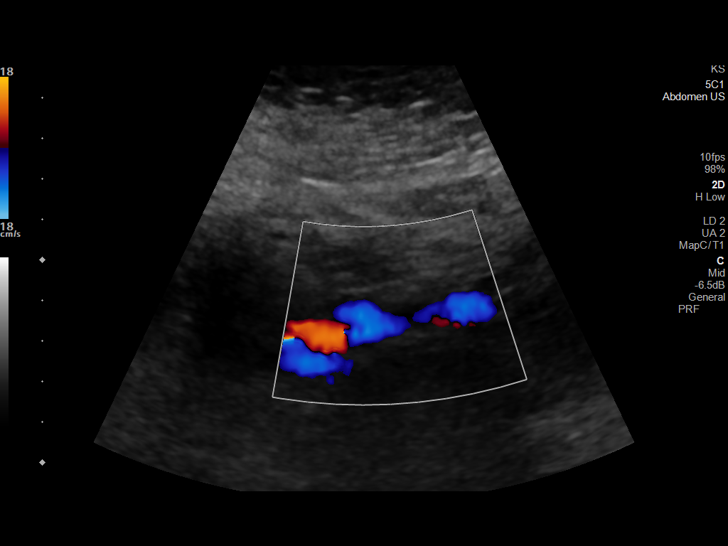

[14 of 25 positions shown; findings below may reference images not displayed]

FINDINGS: Gallbladder: No gallstones or wall thickening visualized. No
sonographic Murphy sign noted by sonographer.

Common bile duct: Diameter: Normal, 2 mm

Liver: No focal lesion identified. Within normal limits in
parenchymal echogenicity. Portal vein is patent on color Doppler
imaging with normal direction of blood flow towards the liver.

IVC: No abnormality visualized.

Pancreas: Visualized portion unremarkable.

Spleen: Size and appearance within normal limits.

Right Kidney: Length: 9.6 cm. Echogenicity within normal limits. No
mass or hydronephrosis visualized.

Left Kidney: Length: 10.0 cm. Echogenicity within normal limits. No
mass or hydronephrosis visualized.

Abdominal aorta: No aneurysm visualized.

Other findings: No ascites.
IMPRESSION: No acute process or explanation for abdominal pain/nausea/vomiting.
